# Patient Record
Sex: Female | Born: 1970 | Race: White | Hispanic: Yes | Marital: Single | Smoking: Never smoker
Health system: Southern US, Community
[De-identification: ages and names within clinical notes are randomized; demographics above are authoritative.]

---

## 2003-12-13 ENCOUNTER — Ambulatory Visit (HOSPITAL_COMMUNITY): Admission: RE | Admit: 2003-12-13 | Discharge: 2003-12-13 | Payer: Self-pay | Admitting: *Deleted

## 2004-03-17 ENCOUNTER — Inpatient Hospital Stay (HOSPITAL_COMMUNITY): Admission: AD | Admit: 2004-03-17 | Discharge: 2004-03-19 | Payer: Self-pay | Admitting: Obstetrics and Gynecology

## 2009-08-28 ENCOUNTER — Ambulatory Visit (HOSPITAL_COMMUNITY): Admission: RE | Admit: 2009-08-28 | Discharge: 2009-08-28 | Payer: Self-pay | Admitting: Obstetrics & Gynecology

## 2009-12-02 ENCOUNTER — Ambulatory Visit (HOSPITAL_COMMUNITY): Admission: RE | Admit: 2009-12-02 | Discharge: 2009-12-02 | Payer: Self-pay | Admitting: *Deleted

## 2010-01-09 ENCOUNTER — Inpatient Hospital Stay (HOSPITAL_COMMUNITY): Admission: AD | Admit: 2010-01-09 | Discharge: 2010-01-09 | Payer: Self-pay | Admitting: Family Medicine

## 2010-01-09 ENCOUNTER — Ambulatory Visit: Payer: Self-pay | Admitting: Family

## 2010-01-11 ENCOUNTER — Ambulatory Visit: Payer: Self-pay | Admitting: Obstetrics & Gynecology

## 2010-01-11 ENCOUNTER — Inpatient Hospital Stay (HOSPITAL_COMMUNITY): Admission: AD | Admit: 2010-01-11 | Discharge: 2010-01-13 | Payer: Self-pay | Admitting: Obstetrics & Gynecology

## 2010-03-08 ENCOUNTER — Emergency Department (HOSPITAL_COMMUNITY): Admission: EM | Admit: 2010-03-08 | Discharge: 2010-03-08 | Payer: Self-pay | Admitting: Emergency Medicine

## 2011-01-11 LAB — RPR: RPR Ser Ql: NONREACTIVE

## 2011-01-11 LAB — CBC
HCT: 29 % — ABNORMAL LOW (ref 36.0–46.0)
MCHC: 33.7 g/dL (ref 30.0–36.0)
Platelets: 123 10*3/uL — ABNORMAL LOW (ref 150–400)
RBC: 3.99 MIL/uL (ref 3.87–5.11)
RDW: 15.1 % (ref 11.5–15.5)
WBC: 12.1 10*3/uL — ABNORMAL HIGH (ref 4.0–10.5)

## 2014-08-22 ENCOUNTER — Encounter (HOSPITAL_COMMUNITY): Payer: Self-pay | Admitting: Emergency Medicine

## 2014-08-22 ENCOUNTER — Emergency Department (HOSPITAL_COMMUNITY)
Admission: EM | Admit: 2014-08-22 | Discharge: 2014-08-23 | Disposition: A | Discharge status: Home / Self Care | Payer: Worker's Compensation | Attending: Emergency Medicine | Admitting: Emergency Medicine

## 2014-08-22 DIAGNOSIS — W07XXXA Fall from chair, initial encounter: Secondary | ICD-10-CM | POA: Insufficient documentation

## 2014-08-22 DIAGNOSIS — Y990 Civilian activity done for income or pay: Secondary | ICD-10-CM | POA: Diagnosis not present

## 2014-08-22 DIAGNOSIS — S6992XA Unspecified injury of left wrist, hand and finger(s), initial encounter: Secondary | ICD-10-CM | POA: Diagnosis not present

## 2014-08-22 DIAGNOSIS — M545 Low back pain, unspecified: Secondary | ICD-10-CM

## 2014-08-22 DIAGNOSIS — Y9289 Other specified places as the place of occurrence of the external cause: Secondary | ICD-10-CM | POA: Insufficient documentation

## 2014-08-22 DIAGNOSIS — S79912A Unspecified injury of left hip, initial encounter: Secondary | ICD-10-CM | POA: Insufficient documentation

## 2014-08-22 DIAGNOSIS — S3992XA Unspecified injury of lower back, initial encounter: Secondary | ICD-10-CM | POA: Diagnosis present

## 2014-08-22 DIAGNOSIS — W010XXA Fall on same level from slipping, tripping and stumbling without subsequent striking against object, initial encounter: Secondary | ICD-10-CM | POA: Diagnosis not present

## 2014-08-22 DIAGNOSIS — W19XXXA Unspecified fall, initial encounter: Secondary | ICD-10-CM

## 2014-08-22 DIAGNOSIS — M79642 Pain in left hand: Secondary | ICD-10-CM

## 2014-08-22 DIAGNOSIS — S79911A Unspecified injury of right hip, initial encounter: Secondary | ICD-10-CM | POA: Insufficient documentation

## 2014-08-22 DIAGNOSIS — Y93E5 Activity, floor mopping and cleaning: Secondary | ICD-10-CM | POA: Insufficient documentation

## 2014-08-22 MED ORDER — IBUPROFEN 400 MG PO TABS
800.0000 mg | ORAL_TABLET | Freq: Once | ORAL | Status: AC
  Administered 2014-08-22: 800 mg via ORAL
  Filled 2014-08-22: qty 2

## 2014-08-22 NOTE — ED Notes (Signed)
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  She said she was on top of a table trying to clean a vent and she slipped and fell on her back.  She tried to catch herself with her left hand and injured her ring finger.  She is also saying both her legs hurt, her lower back and both her hips.  She is here to be evaluated.  She is a workman's comp case.

## 2014-08-22 NOTE — ED Provider Notes (Signed)
CSN: [id number]     Arrival date & time 08/22/14  2239 History  This chart was scribed for non-physician practitioner, Rhea BleacherJosh Gaile Allmon, PA-C working with Derwood KaplanAnkit Nanavati, MD, by Jarvis Morganaylor Ferguson, ED Scribe. This patient was seen in room TR11C/TR11C and the patient's care was started at 11:43 PM.   Chief Complaint  Patient presents with  . 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       The history is provided by the patient. No language interpreter was used.    HPI Comments: Felicia Spencer is a 43 y.o. female who presents to the Emergency Department due to a fall that occurred at her work 1 day ago. 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 She was several feet off the ground when she fell. When she fell she tried to catch herself with her hand and injured her left ring finger. Pt is complaining of bilateral leg pain, lower back pain and bilateral hip pain. No head or neck injury. No LOC. She has not taken anything for the pain. Pt is able to ambulate. She denies urinary or bowel incontinence, or swelling in any of the injured areas.   History reviewed. No pertinent past medical history. History reviewed. No pertinent past surgical history. History reviewed. No pertinent family history. History  Substance Use Topics  . Smoking status: Never Smoker   . Smokeless tobacco: Never Used  . Alcohol Use: No   OB History    No data available     Review of Systems  Constitutional: Negative for fever and unexpected weight change.  Gastrointestinal: Negative for constipation.       Negative for fecal incontinence.   Genitourinary: Negative for dysuria, hematuria, flank pain, vaginal bleeding, vaginal discharge and pelvic pain.       Negative for urinary incontinence or retention.  Musculoskeletal: Positive for  back pain and arthralgias. Negative for joint swelling, gait problem and neck pain.  Skin: Negative for wound.  Neurological: Negative for syncope, weakness, numbness and headaches.       Denies saddle paresthesias.    Allergies  Review of patient's allergies indicates not on file.  Home Medications   Prior to Admission medications   Not on File   Triage Vitals:  BP 127/81 mmHg  Pulse 85  Temp(Src) 98.5 F (36.9 C)  Resp 16  Ht 5\' 5"  (1.651 m)  Wt 180 lb (81.647 kg)  BMI 29.95 kg/m2  SpO2 99%  Physical Exam  Constitutional: She appears well-developed and well-nourished. No distress.  HENT:  Head: Normocephalic and atraumatic.  Eyes: Conjunctivae and EOM are normal.  Neck: Normal range of motion. Neck supple. No tracheal deviation present.  Cardiovascular: Normal rate.   Pulmonary/Chest: Effort normal. No respiratory distress.  Abdominal: Soft. There is no tenderness. There is no CVA tenderness.  Musculoskeletal: Normal range of motion.       Right hip: She exhibits tenderness. She exhibits normal range of motion, normal strength and no bony tenderness.       Left hip: She exhibits tenderness. She exhibits normal range of motion, normal strength and no bony tenderness.       Right knee: Normal.       Left knee: Normal.  Right ankle: Normal.       Left ankle: Normal.       Cervical back: She exhibits normal range of motion, no tenderness and no bony tenderness.       Thoracic back: She exhibits normal range of motion, no tenderness and no bony tenderness.       Lumbar back: She exhibits tenderness. She exhibits normal range of motion and no bony tenderness.       Back:       Right hand: Normal.       Left hand: She exhibits tenderness and bony tenderness. She exhibits normal range of motion. Normal sensation noted. Normal strength noted.       Hands: No step-off noted with palpation of spine.   Neurological: She is alert. She has normal strength and normal  reflexes. No sensory deficit.  5/5 strength in entire lower extremities bilaterally. No sensation deficit.   Skin: Skin is warm and dry. No rash noted.  Psychiatric: She has a normal mood and affect. Her behavior is normal.  Nursing note and vitals reviewed.   ED Course  Procedures (including critical care time)  DIAGNOSTIC STUDIES: Oxygen Saturation is 99% on RA, normal by my interpretation.    COORDINATION OF CARE: 11:46 PM- Will order diagnostic imaging to assess for any injuries due to the fall. Will also order Ibuprofen for the pain.  Pt advised of plan for treatment and pt agrees.    Labs Review Labs Reviewed - No data to display  Imaging Review No results found.   EKG Interpretation None       Vital signs reviewed and are as follows: Filed Vitals:   08/23/14 0038  BP: 127/78  Pulse: 82  Temp: 97.7 F (36.5 C)  Resp: 18   X-ray negative. Patient informed.  No red flag s/s of low back pain. Patient was counseled on back pain precautions and told to do activity as tolerated but do not lift, push, or pull heavy objects more than 10 pounds for the next week.  Patient counseled to use ice or heat on back for no longer than 15 minutes every hour.   Patient prescribed muscle relaxer and counseled on proper use of muscle relaxant medication.    Urged patient not to drink alcohol, drive, or perform any other activities that requires focus while taking this medications.  Patient urged to follow-up with PCP if pain does not improve with treatment and rest or if pain becomes recurrent. Urged to return with worsening severe pain, loss of bowel or bladder control, trouble walking.   The patient verbalizes understanding and agrees with the plan.    MDM   Final diagnoses:  Fall  Bilateral low back pain without sciatica  Pain of left hand   Patient with back pain after all. Imaging negative. No neurological deficits. Patient is ambulatory. No warning symptoms of back  pain including: fecal incontinence, urinary retention or overflow incontinence, night sweats, waking from sleep with back pain, unexplained fevers or weight loss, h/o cancer, IVDU, recent trauma. No concern for cauda equina, epidural abscess, or other serious cause of back pain. Conservative measures such as rest, ice/heat and pain medicine indicated with PCP follow-up if no improvement with conservative management.     I personally performed the services described in this documentation, which was scribed in my presence. The recorded information has been reviewed and is accurate.     Renne CriglerJoshua Arynn Armand, PA-C 08/23/14 [id number]  Derwood KaplanAnkit Nanavati, MD 08/23/14 0830

## 2014-08-23 ENCOUNTER — Emergency Department (HOSPITAL_COMMUNITY): Payer: Worker's Compensation

## 2014-08-23 MED ORDER — METHOCARBAMOL 500 MG PO TABS: 1000.0000 mg | ORAL_TABLET | Freq: Four times a day (QID) | ORAL | Status: DC

## 2014-08-23 MED ORDER — NAPROXEN 500 MG PO TABS: 500.0000 mg | ORAL_TABLET | Freq: Two times a day (BID) | ORAL | Status: DC

## 2014-08-23 NOTE — Discharge Instructions (Signed)
Please read and follow all provided instructions.  Your diagnoses today include:  1. Bilateral low back pain without sciatica   2. Fall   3. Pain of left hand     Tests performed today include:  Vital signs - see below for your results today  X-ray of back and left hand - no broken bones  Medications prescribed:   Robaxin (methocarbamol) - muscle relaxer medication  DO NOT drive or perform any activities that require you to be awake and alert because this medicine can make you drowsy.    Naproxen - anti-inflammatory pain medication  Do not exceed 500mg  naproxen every 12 hours, take with food  You have been prescribed an anti-inflammatory medication or NSAID. Take with food. Take smallest effective dose for the shortest duration needed for your pain. Stop taking if you experience stomach pain or vomiting.   Take any prescribed medications only as directed.  Home care instructions:   Follow any educational materials contained in this packet  Please rest, use ice or heat on your back for the next several days  Do not lift, push, pull anything more than 10 pounds for the next week  Follow-up instructions: Please follow-up with your primary care provider in the next 1 week for further evaluation of your symptoms.   Return instructions:  SEEK IMMEDIATE MEDICAL ATTENTION IF YOU HAVE:  New numbness, tingling, weakness, or problem with the use of your arms or legs  Severe back pain not relieved with medications  Loss control of your bowels or bladder  Increasing pain in any areas of the body (such as chest or abdominal pain)  Shortness of breath, dizziness, or fainting.   Worsening nausea (feeling sick to your stomach), vomiting, fever, or sweats  Any other emergent concerns regarding your health   Additional Information:  Your vital signs today were: BP 127/81 mmHg   Pulse 85   Temp(Src) 98.5 F (36.9 C)   Resp 16   Ht 5\' 5"  (1.651 m)   Wt 180 lb (81.647 kg)   BMI  29.95 kg/m2   SpO2 99% If your blood pressure (BP) was elevated above 135/85 this visit, please have this repeated by your doctor within one month. --------------

## 2016-07-08 IMAGING — CR DG LUMBAR SPINE COMPLETE 4+V
5 series · 5 of 5 positions shown · non-contrast
Comparison: None.

CLINICAL DATA: Low back pain after a fall.

EXAM:
LUMBAR SPINE - COMPLETE 4+ VIEW

[t l-spine a.p.]
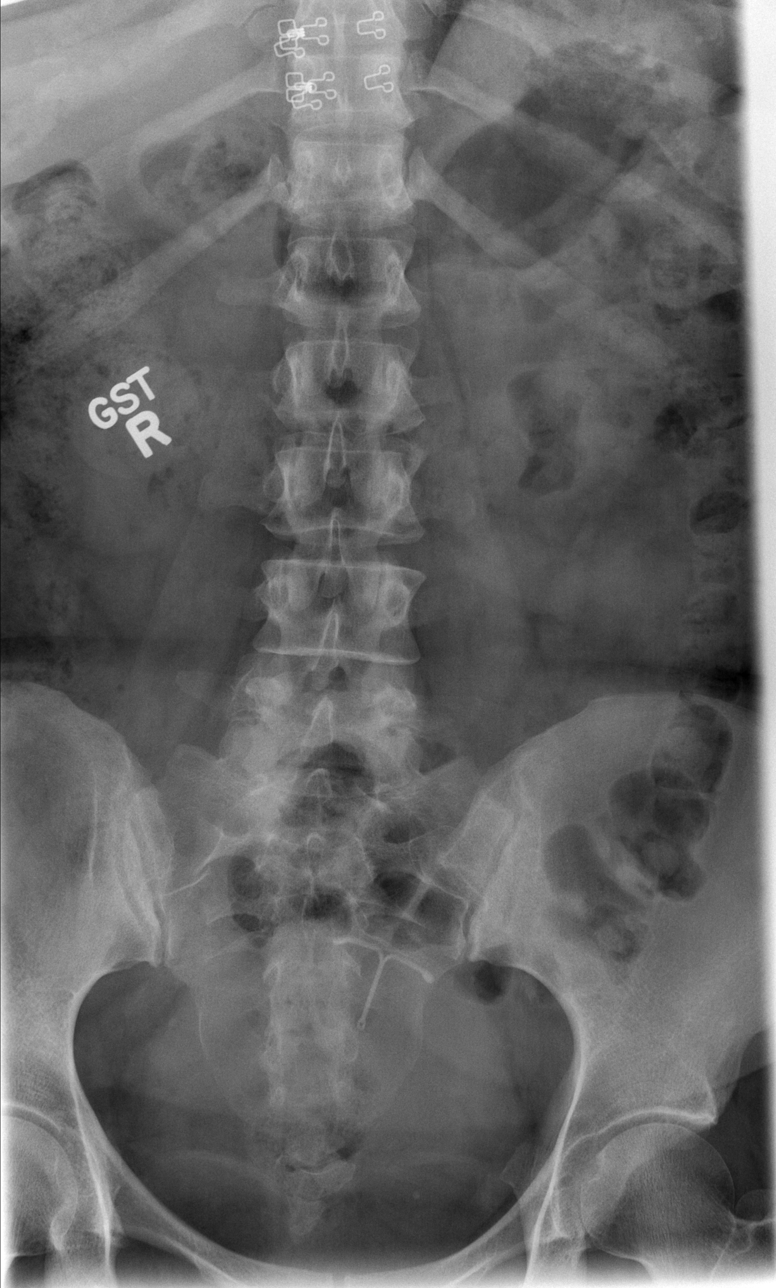

[t l-spine oblique exposure (1 of 2)]
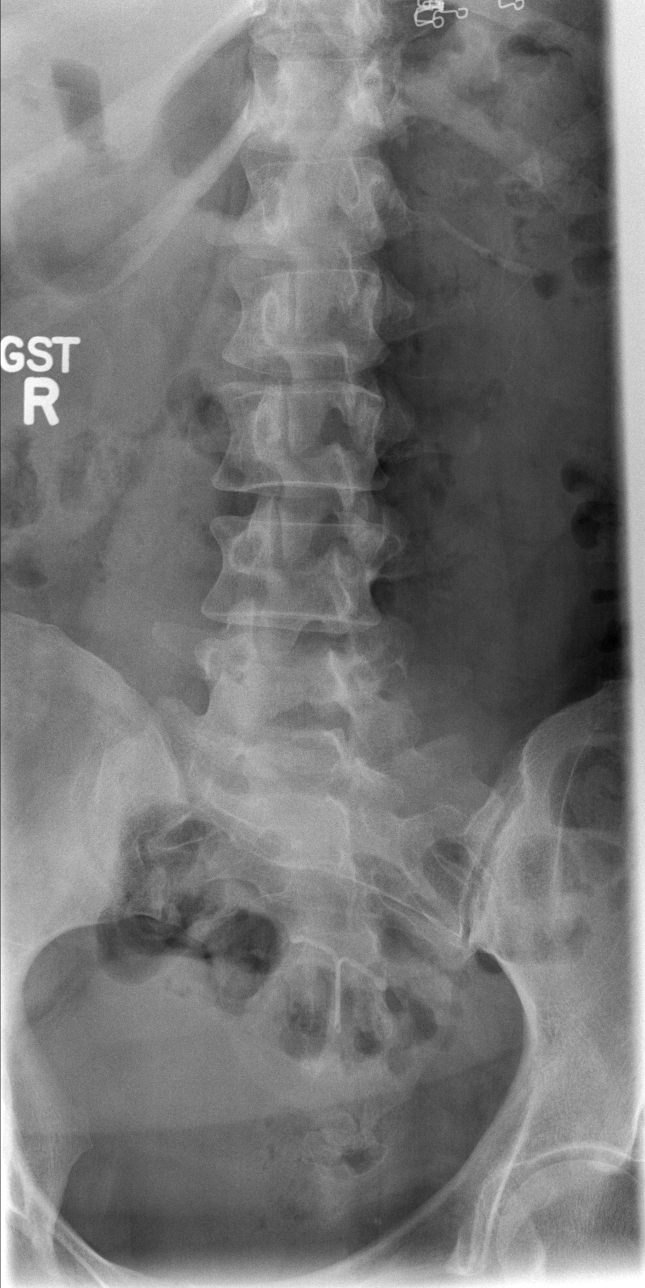

[t l-spine oblique exposure (2 of 2)]
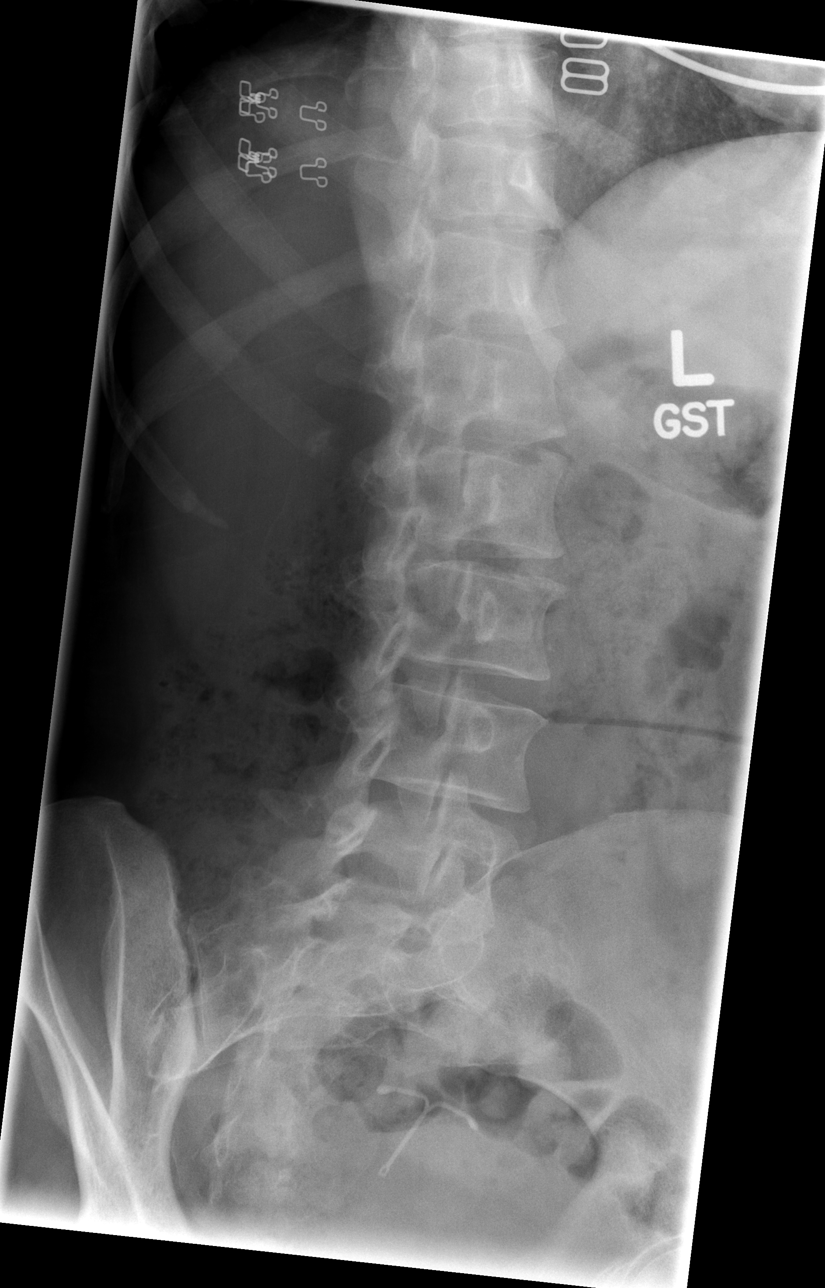

[t l-spine lat]
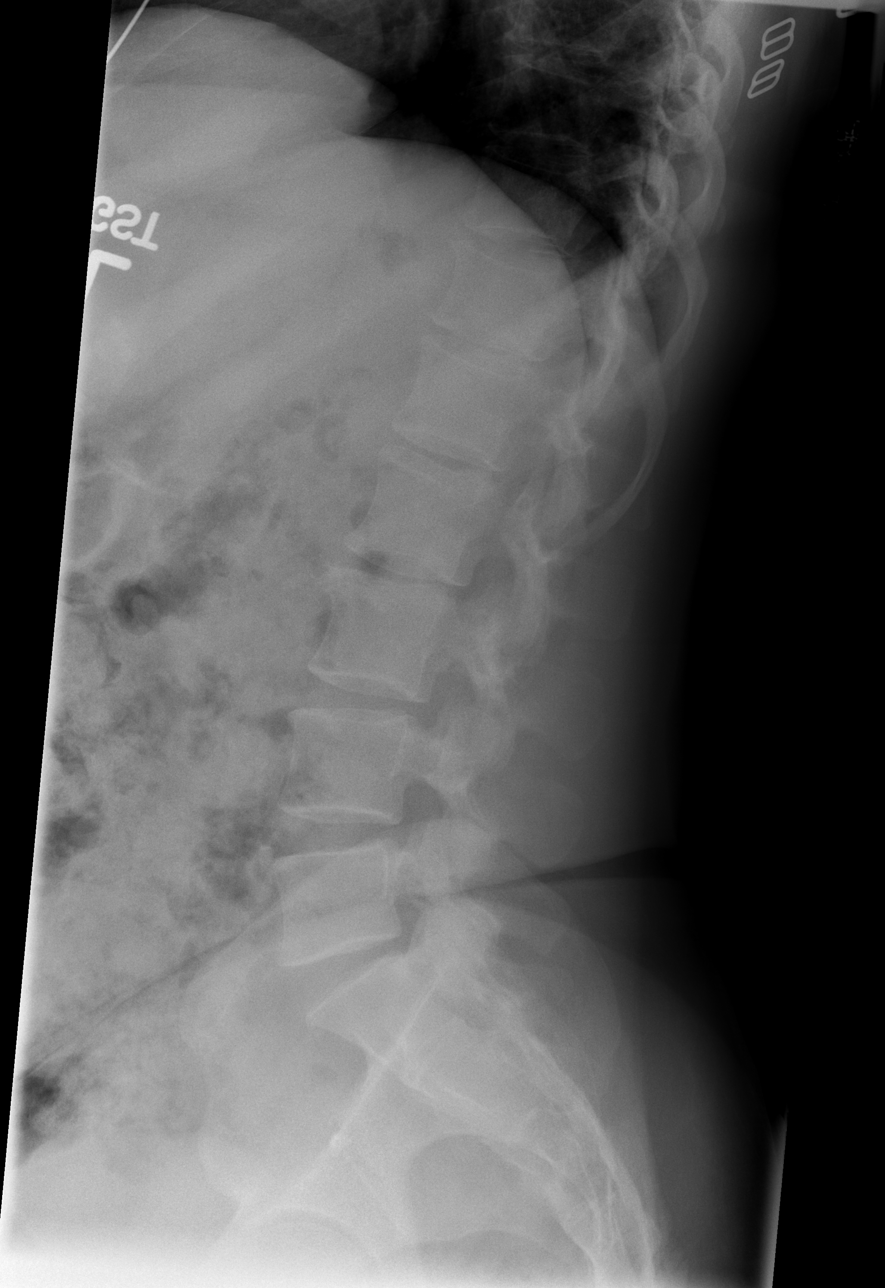

[t l-spine l5-s1 spot]
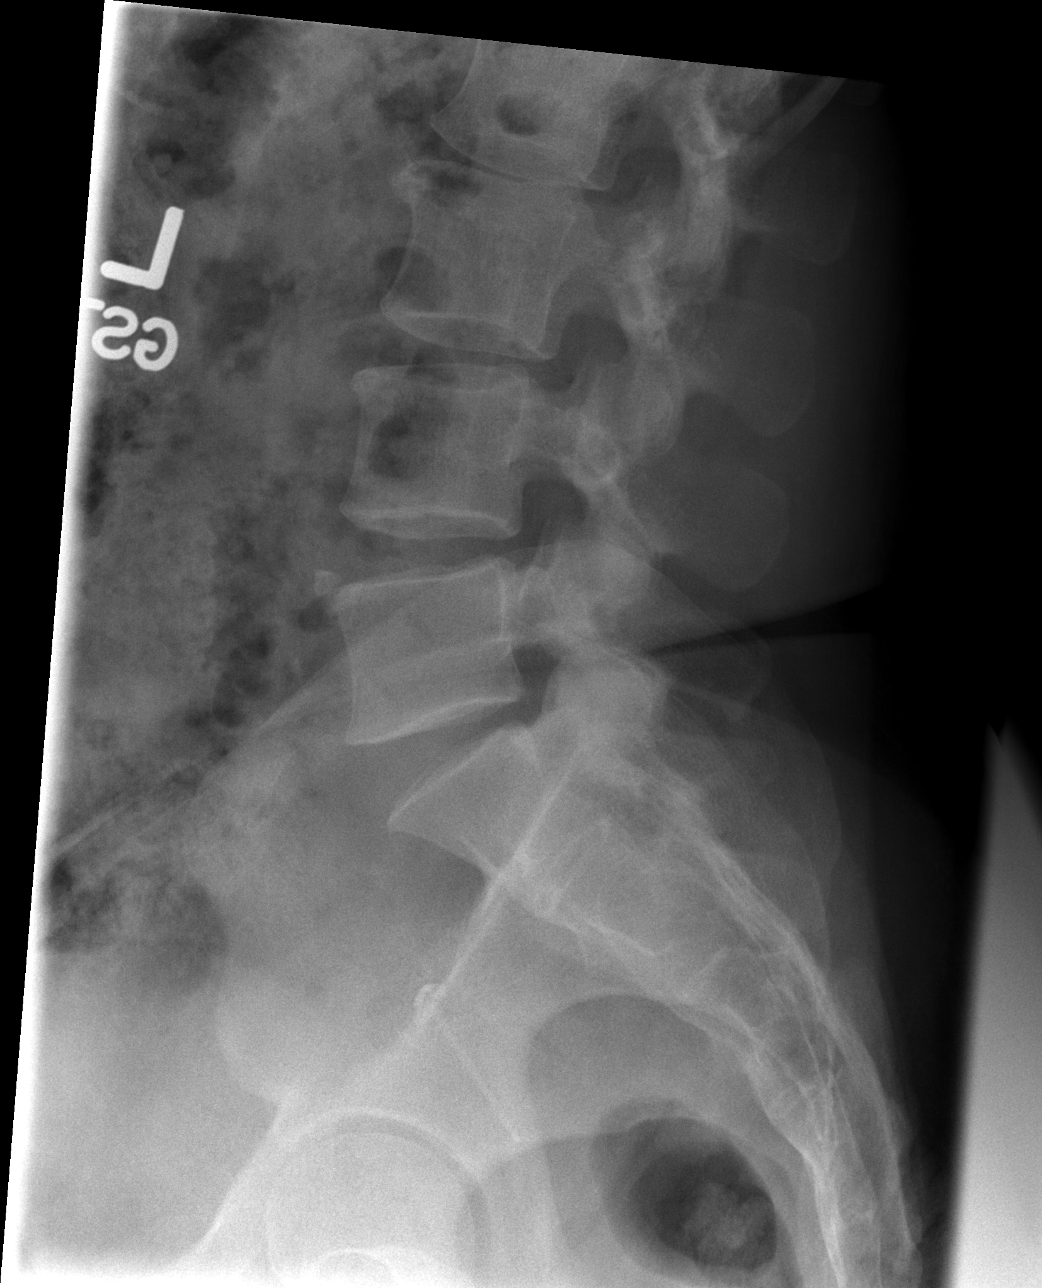

[5 of 5 positions shown; findings below may reference images not displayed]

FINDINGS: Old ununited ossicle demonstrated at the left L5 anterior superior
endplate. The normal alignment of the lumbar spine. Mild
degenerative changes with endplate hypertrophic changes. No focal
bone lesion or bone destruction. Normal alignment of facet joints.
Intrauterine device visualized.
IMPRESSION: Normal alignment of the lumbar spine. No acute bony abnormalities
suggested.

## 2016-10-28 ENCOUNTER — Other Ambulatory Visit (HOSPITAL_COMMUNITY): Payer: Self-pay | Admitting: *Deleted

## 2016-10-28 DIAGNOSIS — N6310 Unspecified lump in the right breast, unspecified quadrant: Secondary | ICD-10-CM

## 2016-11-10 ENCOUNTER — Encounter (HOSPITAL_COMMUNITY): Payer: Self-pay

## 2016-11-10 ENCOUNTER — Ambulatory Visit
Admission: RE | Admit: 2016-11-10 | Discharge: 2016-11-10 | Disposition: A | Discharge status: Home / Self Care | Payer: No Typology Code available for payment source | Source: Ambulatory Visit | Attending: Obstetrics and Gynecology | Admitting: Obstetrics and Gynecology

## 2016-11-10 ENCOUNTER — Ambulatory Visit (HOSPITAL_COMMUNITY)
Admission: RE | Admit: 2016-11-10 | Discharge: 2016-11-10 | Disposition: A | Discharge status: Home / Self Care | Payer: Self-pay | Source: Ambulatory Visit | Attending: Obstetrics and Gynecology | Admitting: Obstetrics and Gynecology

## 2016-11-10 ENCOUNTER — Ambulatory Visit
Admission: RE | Admit: 2016-11-10 | Discharge: 2016-11-10 | Disposition: A | Discharge status: Home / Self Care | Payer: Self-pay | Source: Ambulatory Visit | Attending: Obstetrics and Gynecology | Admitting: Obstetrics and Gynecology

## 2016-11-10 VITALS — BP 112/78 | Temp 98.1°F | Ht 60.0 in | Wt 191.4 lb

## 2016-11-10 DIAGNOSIS — N6310 Unspecified lump in the right breast, unspecified quadrant: Secondary | ICD-10-CM

## 2016-11-10 DIAGNOSIS — Z1239 Encounter for other screening for malignant neoplasm of breast: Secondary | ICD-10-CM

## 2016-11-10 DIAGNOSIS — N6313 Unspecified lump in the right breast, lower outer quadrant: Secondary | ICD-10-CM

## 2016-11-10 NOTE — Progress Notes (Signed)
Complaints or right breast lump at 9 o'clock for over a month.  Pap Smear: Pap smear not completed today. [facility] and normal. Per patient has no history of an abnormal Pap smear. Last Pap smear result is in EPIC.  Physical exam: Breasts Breasts symmetrical. No skin abnormalities bilateral breasts. No nipple retraction bilateral breasts. No nipple discharge bilateral breasts. No lymphadenopathy. No lumps palpated left breast. Palpated a pea sized lump within the right breast 7 o'clock 3 cm from the nipple. Complaints of tenderness when palpated lump within the right breast. [facility] and possible right breast ultrasound. Appointment scheduled for Tuesday, November 10, 2016 at 1510.        Pelvic/Bimanual No Pap smear completed today since last Pap smear was 05/01/2015. Pap smear not indicated per BCCCP guidelines.   Smoking History: Patient has never smoked.  Patient Navigation: Patient education provided. Access to services provided for patient through [facility]. Spanish interpreter provided.  Used Spanish interpreter [employer] Gal[employer] from [employer].

## 2016-11-10 NOTE — Patient Instructions (Signed)
Explained breast self awareness to Felicia Spencer. Patient did not need a Pap smear today due to last Pap smear was 05/01/2015. Let her know BCCCP will cover Pap smears every 3 years unless has a history of abnormal Pap smears. [facility] for diagnostic mammogram and possible right breast ultrasound. Appointment scheduled for Tuesday, November 10, 2016 at 1510. [name] verbalized understanding.  Jamarco Zaldivar, Kathaleen Maserhristine Poll, RN 2:59 PM

## 2016-11-13 ENCOUNTER — Encounter (HOSPITAL_COMMUNITY): Payer: Self-pay | Admitting: *Deleted

## 2018-12-21 ENCOUNTER — Emergency Department (HOSPITAL_COMMUNITY)
Admission: EM | Admit: 2018-12-21 | Discharge: 2018-12-21 | Disposition: A | Discharge status: Home / Self Care | Payer: No Typology Code available for payment source | Attending: Emergency Medicine | Admitting: Emergency Medicine

## 2018-12-21 ENCOUNTER — Encounter (HOSPITAL_COMMUNITY): Payer: Self-pay

## 2018-12-21 ENCOUNTER — Emergency Department (HOSPITAL_COMMUNITY): Payer: No Typology Code available for payment source

## 2018-12-21 ENCOUNTER — Other Ambulatory Visit: Payer: Self-pay

## 2018-12-21 DIAGNOSIS — T07XXXA Unspecified multiple injuries, initial encounter: Secondary | ICD-10-CM

## 2018-12-21 DIAGNOSIS — S29012A Strain of muscle and tendon of back wall of thorax, initial encounter: Secondary | ICD-10-CM | POA: Diagnosis not present

## 2018-12-21 DIAGNOSIS — S46911A Strain of unspecified muscle, fascia and tendon at shoulder and upper arm level, right arm, initial encounter: Secondary | ICD-10-CM | POA: Insufficient documentation

## 2018-12-21 DIAGNOSIS — Y929 Unspecified place or not applicable: Secondary | ICD-10-CM | POA: Diagnosis not present

## 2018-12-21 DIAGNOSIS — R51 Headache: Secondary | ICD-10-CM | POA: Diagnosis not present

## 2018-12-21 DIAGNOSIS — Y939 Activity, unspecified: Secondary | ICD-10-CM | POA: Insufficient documentation

## 2018-12-21 DIAGNOSIS — S161XXA Strain of muscle, fascia and tendon at neck level, initial encounter: Secondary | ICD-10-CM | POA: Diagnosis not present

## 2018-12-21 DIAGNOSIS — Y999 Unspecified external cause status: Secondary | ICD-10-CM | POA: Insufficient documentation

## 2018-12-21 MED ORDER — NAPROXEN 500 MG PO TABS: 500.0000 mg | ORAL_TABLET | Freq: Two times a day (BID) | ORAL | 0 refills | Status: DC

## 2018-12-21 MED ORDER — CYCLOBENZAPRINE HCL 10 MG PO TABS
5.0000 mg | ORAL_TABLET | Freq: Once | ORAL | Status: AC
  Administered 2018-12-21: 5 mg via ORAL
  Filled 2018-12-21: qty 1

## 2018-12-21 MED ORDER — CYCLOBENZAPRINE HCL 10 MG PO TABS: 10.0000 mg | ORAL_TABLET | Freq: Two times a day (BID) | ORAL | 0 refills | Status: AC | PRN

## 2018-12-21 MED ORDER — NAPROXEN 250 MG PO TABS
500.0000 mg | ORAL_TABLET | Freq: Once | ORAL | Status: AC
  Administered 2018-12-21: 500 mg via ORAL
  Filled 2018-12-21: qty 2
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
## 2018-12-21 NOTE — ED Notes (Signed)
Patient verbalizes understanding of discharge instructions. Opportunity for questioning and answers were provided. Armband removed by staff, pt discharged from ED.  

## 2019-07-07 ENCOUNTER — Other Ambulatory Visit: Payer: Self-pay

## 2019-07-07 DIAGNOSIS — Z20822 Contact with and (suspected) exposure to covid-19: Secondary | ICD-10-CM

## 2019-07-08 LAB — NOVEL CORONAVIRUS, NAA: SARS-CoV-2, NAA: DETECTED — AB

## 2019-07-20 ENCOUNTER — Other Ambulatory Visit: Payer: Self-pay | Admitting: Emergency Medicine

## 2019-07-20 DIAGNOSIS — Z20822 Contact with and (suspected) exposure to covid-19: Secondary | ICD-10-CM

## 2019-07-24 ENCOUNTER — Other Ambulatory Visit: Payer: Self-pay

## 2019-07-24 DIAGNOSIS — Z20822 Contact with and (suspected) exposure to covid-19: Secondary | ICD-10-CM

## 2019-07-24 LAB — NOVEL CORONAVIRUS, NAA: SARS-CoV-2, NAA: NOT DETECTED

## 2019-07-24 NOTE — Addendum Note (Signed)
Addended by: Jonelle Sidle E on: 07/24/2019 02:13 PM   Modules accepted: Orders

## 2020-07-10 ENCOUNTER — Telehealth: Payer: Self-pay

## 2020-07-10 ENCOUNTER — Other Ambulatory Visit: Payer: Self-pay

## 2020-07-10 ENCOUNTER — Ambulatory Visit
Admission: EM | Admit: 2020-07-10 | Discharge: 2020-07-10 | Disposition: A | Discharge status: Home / Self Care | Payer: Self-pay | Attending: Emergency Medicine | Admitting: Emergency Medicine

## 2020-07-10 DIAGNOSIS — S161XXA Strain of muscle, fascia and tendon at neck level, initial encounter: Secondary | ICD-10-CM

## 2020-07-10 DIAGNOSIS — M545 Low back pain, unspecified: Secondary | ICD-10-CM

## 2020-07-10 DIAGNOSIS — M546 Pain in thoracic spine: Secondary | ICD-10-CM

## 2020-07-10 MED ORDER — NAPROXEN 500 MG PO TABS: 500.0000 mg | ORAL_TABLET | Freq: Two times a day (BID) | ORAL | 0 refills | Status: DC

## 2020-07-10 MED ORDER — CYCLOBENZAPRINE HCL 5 MG PO TABS: 5.0000 mg | ORAL_TABLET | Freq: Two times a day (BID) | ORAL | 0 refills | Status: AC | PRN

## 2020-07-10 MED ORDER — CYCLOBENZAPRINE HCL 5 MG PO TABS: 5.0000 mg | ORAL_TABLET | Freq: Two times a day (BID) | ORAL | 0 refills | Status: DC | PRN

## 2020-07-10 MED ORDER — NAPROXEN 500 MG PO TABS: 500.0000 mg | ORAL_TABLET | Freq: Two times a day (BID) | ORAL | 0 refills | Status: AC
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
## 2020-07-10 NOTE — Discharge Instructions (Signed)
Naprosyn twice daily for pain/headache You may use flexeril as needed to help with pain. This is a muscle relaxer and causes sedation- please use only at bedtime or when you will be home and not have to drive/work Gentle stretching and movement of neck and shoulder Alternate ice and heat Follow up if not improving or worsening
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
## 2023-11-25 ENCOUNTER — Other Ambulatory Visit: Payer: Self-pay | Admitting: Obstetrics & Gynecology

## 2023-11-25 ENCOUNTER — Encounter: Payer: Self-pay | Admitting: Obstetrics & Gynecology

## 2023-11-25 DIAGNOSIS — Z1231 Encounter for screening mammogram for malignant neoplasm of breast: Secondary | ICD-10-CM
